# Patient Record
Sex: Female | Born: 1955 | Race: White | Hispanic: No | Marital: Married
Health system: Southern US, Community
[De-identification: ages and names within clinical notes are randomized; demographics above are authoritative.]

---

## 1997-06-20 ENCOUNTER — Ambulatory Visit (HOSPITAL_COMMUNITY): Admission: RE | Admit: 1997-06-20 | Discharge: 1997-06-20 | Payer: Self-pay | Admitting: Obstetrics & Gynecology

## 1997-11-17 ENCOUNTER — Other Ambulatory Visit: Admission: RE | Admit: 1997-11-17 | Discharge: 1997-11-17 | Payer: Self-pay | Admitting: Obstetrics & Gynecology

## 1998-12-03 ENCOUNTER — Other Ambulatory Visit: Admission: RE | Admit: 1998-12-03 | Discharge: 1998-12-03 | Payer: Self-pay | Admitting: Obstetrics & Gynecology

## 2000-02-04 ENCOUNTER — Other Ambulatory Visit: Admission: RE | Admit: 2000-02-04 | Discharge: 2000-02-04 | Payer: Self-pay | Admitting: Obstetrics & Gynecology

## 2001-02-04 ENCOUNTER — Other Ambulatory Visit: Admission: RE | Admit: 2001-02-04 | Discharge: 2001-02-04 | Payer: Self-pay | Admitting: Obstetrics & Gynecology

## 2002-04-08 ENCOUNTER — Other Ambulatory Visit: Admission: RE | Admit: 2002-04-08 | Discharge: 2002-04-08 | Payer: Self-pay | Admitting: Obstetrics and Gynecology

## 2003-05-03 ENCOUNTER — Emergency Department (HOSPITAL_COMMUNITY): Admission: EM | Admit: 2003-05-03 | Discharge: 2003-05-03 | Payer: Self-pay | Admitting: Emergency Medicine

## 2003-05-09 ENCOUNTER — Inpatient Hospital Stay (HOSPITAL_COMMUNITY): Admission: RE | Admit: 2003-05-09 | Discharge: 2003-05-11 | Payer: Self-pay | Admitting: Orthopedic Surgery

## 2003-09-07 ENCOUNTER — Other Ambulatory Visit: Admission: RE | Admit: 2003-09-07 | Discharge: 2003-09-07 | Payer: Self-pay | Admitting: Obstetrics & Gynecology

## 2004-11-06 IMAGING — CR DG WRIST 2V*L*
2 series · 2 of 2 positions shown · non-contrast
Comparison: none

CLINICAL DATA: Fall, left wrist injury and pain.
 LEFT WRIST 2 VIEWS
 A comminuted fracture is seen involving the distal radius with involvement of both the distal radioulnar and radiocarpal joints.   There is dorsal displacement and angulation of the distal articular surface of the radius as well as mild impaction.   The ulnar styloid is not well profiled on this study and a nondisplaced ulnar styloid fracture cannot be excluded.
 There is no evidence of carpal bone fracture and carpal bone alignment remains normal.
 IMPRESSION
 1.     Comminuted impacted fracture of the distal radius as described above.
 2.     Cannot exclude nondisplaced ulnar styloid fracture on this study.

[view not recorded (1 of 2)]
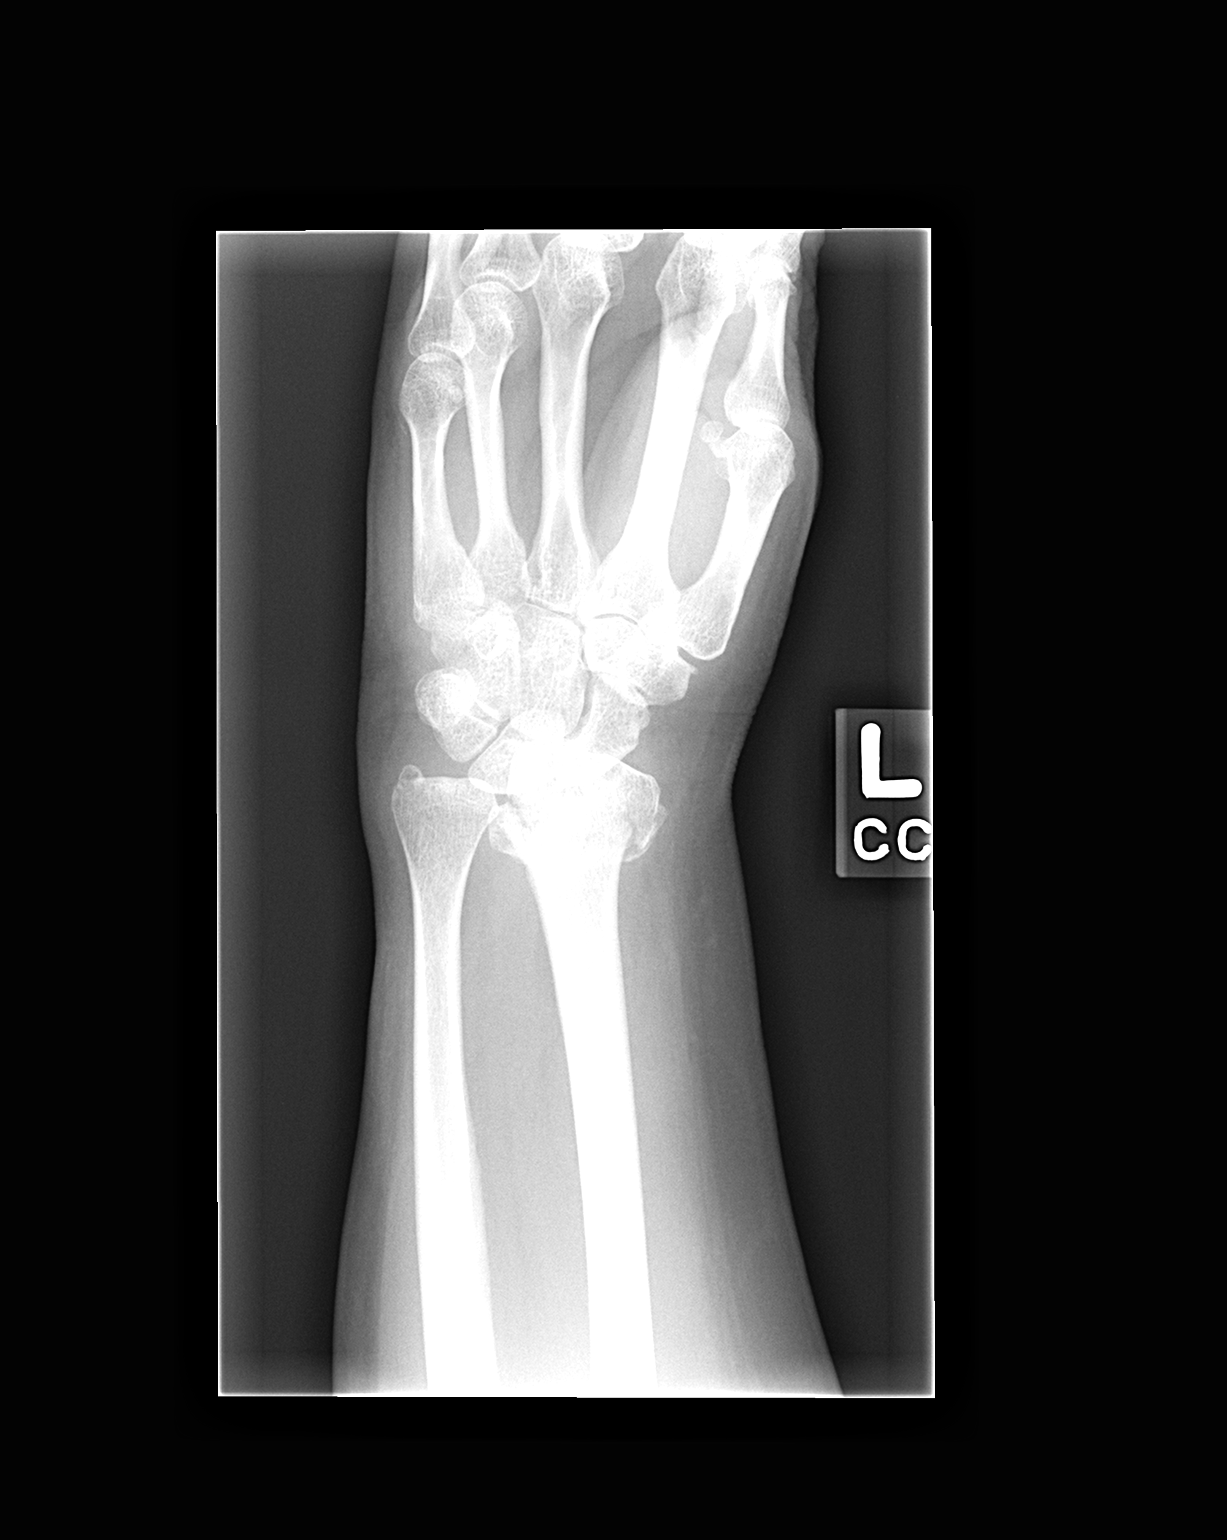

[view not recorded (2 of 2)]
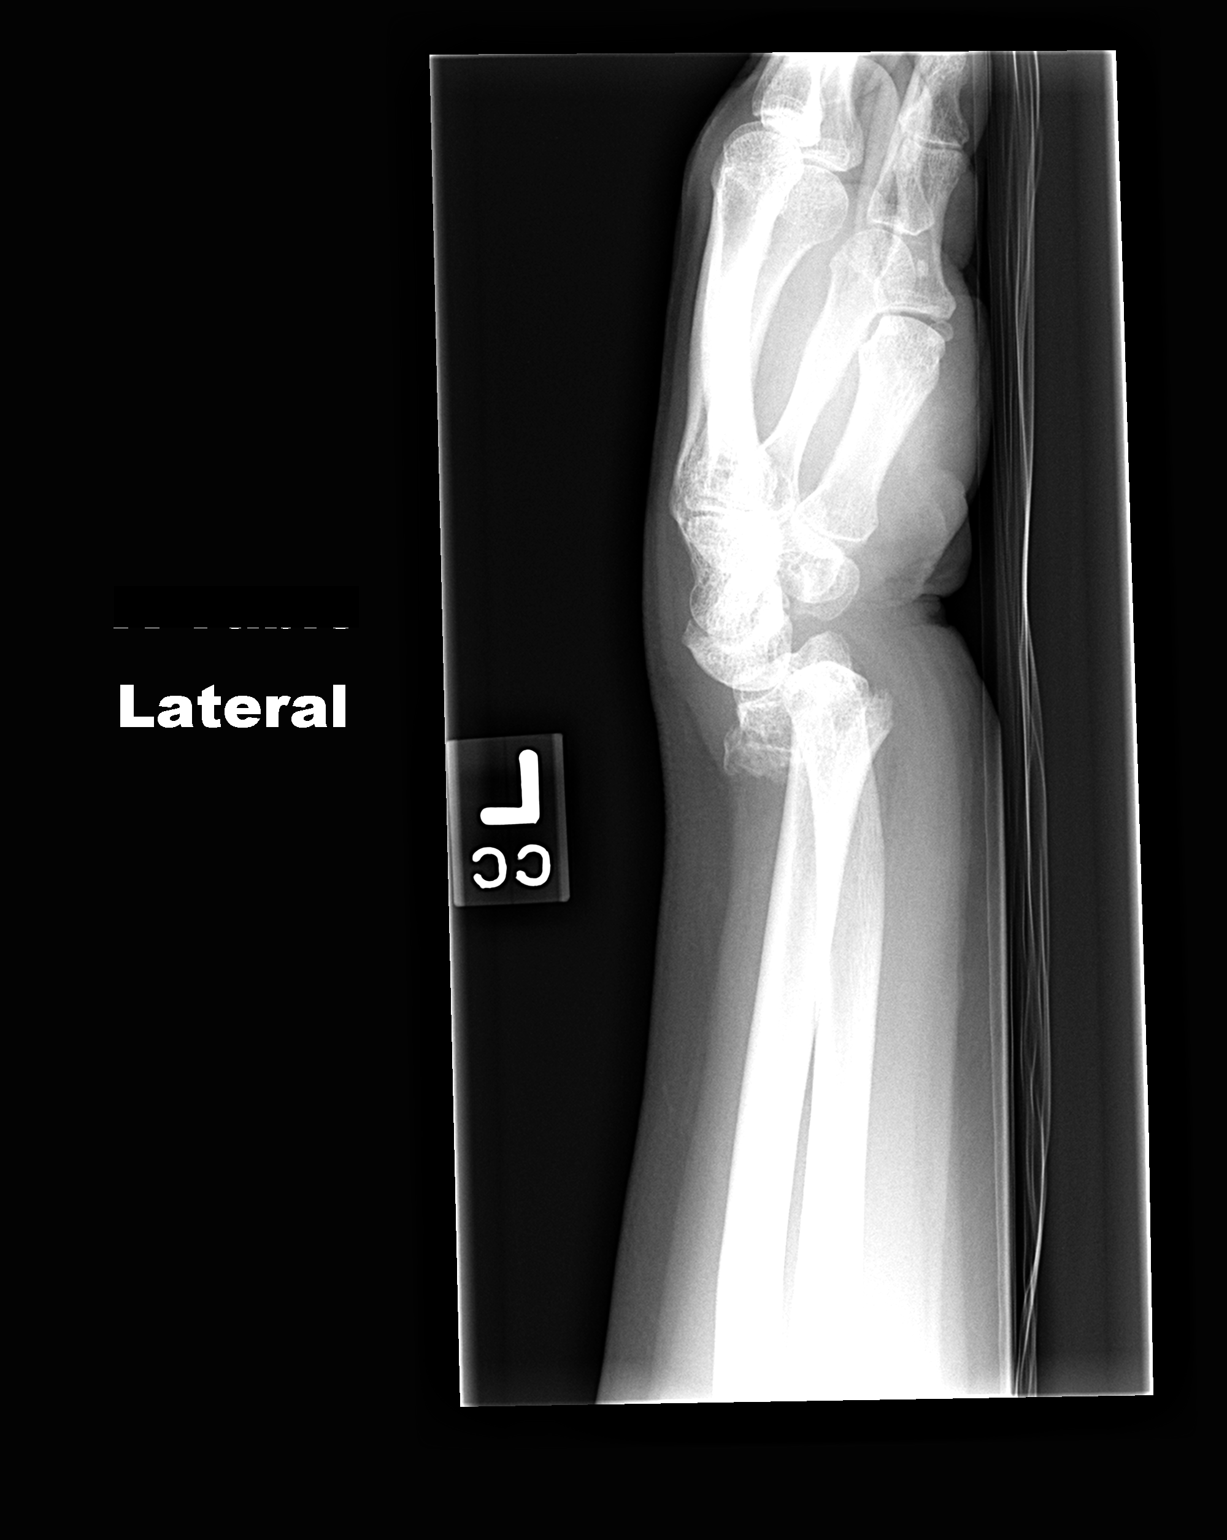

[2 of 2 positions shown; findings below may reference images not displayed]

## 2005-02-21 ENCOUNTER — Other Ambulatory Visit: Admission: RE | Admit: 2005-02-21 | Discharge: 2005-02-21 | Payer: Self-pay | Admitting: Obstetrics & Gynecology

## 2019-04-29 ENCOUNTER — Ambulatory Visit: Payer: Self-pay | Attending: Internal Medicine

## 2019-04-29 DIAGNOSIS — Z23 Encounter for immunization: Secondary | ICD-10-CM

## 2019-04-29 NOTE — Progress Notes (Signed)
   Cov[facility] Vaccination [facility]  Name:  Tacy Chavis    MRN: [id number] DOB: August 02, 1955  04/29/2019  Ms. Coppock was observed post Covid-19 immunization for 15 minutes without incident. She was provided with Vaccine Information Sheet and instruction to access the V-Safe system.   Ms. Stimmel was instructed to call 911 with any severe reactions post vaccine: Marland Kitchen Difficulty breathing  . Swelling of face and throat  . A fast heartbeat  . A bad rash all over body  . Dizziness and weakness   Immunizations Administered    Name Date Dose VIS Date Route   Pfizer COVID-19 Vaccine 04/29/2019 10:21 AM 0.3 mL 01/07/2019 Intramuscular   Manufacturer: [employer], [employer]   Lot: [id number]   NDC: [address]-1

## 2019-05-09 DIAGNOSIS — Z0120 Encounter for dental examination and cleaning without abnormal findings: Secondary | ICD-10-CM | POA: Diagnosis not present

## 2019-05-23 ENCOUNTER — Ambulatory Visit: Payer: Medicare Other | Attending: Internal Medicine

## 2019-05-23 DIAGNOSIS — Z23 Encounter for immunization: Secondary | ICD-10-CM

## 2019-05-23 NOTE — Progress Notes (Signed)
   Covid-19 Vaccination Clinic  Name:  Stephanie Fowler    MRN: [id number] DOB: 04-Dec-1955  05/23/2019  Ms. Cupples was observed post Covid-19 immunization for 15 minutes without incident. She was provided with Vaccine Information Sheet and instruction to access the V-Safe system.   Ms. Riles was instructed to call 911 with any severe reactions post vaccine: Marland Kitchen Difficulty breathing  . Swelling of face and throat  . A fast heartbeat  . A bad rash all over body  . Dizziness and weakness   Immunizations Administered    Name Date Dose VIS Date Route   Pfizer COVID-19 Vaccine 05/23/2019 12:22 PM 0.3 mL 03/23/2018 Intramuscular   Manufacturer: [employer], [employer]   Lot: [id number]   NDC: [address]-9

## 2019-09-12 DIAGNOSIS — Z0120 Encounter for dental examination and cleaning without abnormal findings: Secondary | ICD-10-CM | POA: Diagnosis not present

## 2023-05-05 DIAGNOSIS — K080 Exfoliation of teeth due to systemic causes: Secondary | ICD-10-CM | POA: Diagnosis not present

## 2023-09-30 DIAGNOSIS — K080 Exfoliation of teeth due to systemic causes: Secondary | ICD-10-CM | POA: Diagnosis not present

## 2023-12-07 DIAGNOSIS — K080 Exfoliation of teeth due to systemic causes: Secondary | ICD-10-CM | POA: Diagnosis not present
